# Patient Record
Sex: Male | Born: 1985 | Race: Black or African American | Hispanic: No | Marital: Single | Smoking: Former smoker
Health system: Southern US, Community
[De-identification: ages and names within clinical notes are randomized; demographics above are authoritative.]

---

## 2013-12-11 ENCOUNTER — Emergency Department: Payer: Self-pay | Admitting: Emergency Medicine

## 2014-11-04 ENCOUNTER — Encounter: Payer: Self-pay | Admitting: *Deleted

## 2014-11-04 ENCOUNTER — Emergency Department
Admission: EM | Admit: 2014-11-04 | Discharge: 2014-11-04 | Disposition: A | Discharge status: Home / Self Care | Payer: Self-pay | Attending: Emergency Medicine | Admitting: Emergency Medicine

## 2014-11-04 ENCOUNTER — Other Ambulatory Visit: Payer: Self-pay

## 2014-11-04 DIAGNOSIS — Z87891 Personal history of nicotine dependence: Secondary | ICD-10-CM | POA: Insufficient documentation

## 2014-11-04 DIAGNOSIS — R42 Dizziness and giddiness: Secondary | ICD-10-CM | POA: Insufficient documentation

## 2014-11-04 DIAGNOSIS — M6282 Rhabdomyolysis: Secondary | ICD-10-CM | POA: Insufficient documentation

## 2014-11-04 LAB — URINALYSIS COMPLETE WITH MICROSCOPIC (ARMC ONLY)
BACTERIA UA: NONE SEEN
Bilirubin Urine: NEGATIVE
Glucose, UA: NEGATIVE mg/dL
Hgb urine dipstick: NEGATIVE
Ketones, ur: NEGATIVE mg/dL
Leukocytes, UA: NEGATIVE
NITRITE: NEGATIVE
PH: 6 (ref 5.0–8.0)
PROTEIN: NEGATIVE mg/dL
SPECIFIC GRAVITY, URINE: 1.002 — AB (ref 1.005–1.030)
Squamous Epithelial / LPF: NONE SEEN

## 2014-11-04 LAB — CBC
HCT: 41.6 % (ref 40.0–52.0)
HEMOGLOBIN: 13.6 g/dL (ref 13.0–18.0)
MCH: 26.4 pg (ref 26.0–34.0)
MCHC: 32.7 g/dL (ref 32.0–36.0)
MCV: 80.7 fL (ref 80.0–100.0)
Platelets: 180 10*3/uL (ref 150–440)
RBC: 5.16 MIL/uL (ref 4.40–5.90)
RDW: 13.9 % (ref 11.5–14.5)
WBC: 4.6 10*3/uL (ref 3.8–10.6)

## 2014-11-04 LAB — ETHANOL: Alcohol, Ethyl (B): <5 mg/dL (ref <5)

## 2014-11-04 LAB — URINE DRUG SCREEN, QUALITATIVE (ARMC ONLY)
Amphetamines, Ur Screen: NOT DETECTED
BARBITURATES, UR SCREEN: NOT DETECTED
Benzodiazepine, Ur Scrn: NOT DETECTED
CANNABINOID 50 NG, UR ~~LOC~~: NOT DETECTED
COCAINE METABOLITE, UR ~~LOC~~: NOT DETECTED
MDMA (Ecstasy)Ur Screen: NOT DETECTED
Methadone Scn, Ur: NOT DETECTED
Opiate, Ur Screen: NOT DETECTED
PHENCYCLIDINE (PCP) UR S: NOT DETECTED
Tricyclic, Ur Screen: NOT DETECTED

## 2014-11-04 LAB — BASIC METABOLIC PANEL
Anion gap: 4 — ABNORMAL LOW (ref 5–15)
BUN: 17 mg/dL (ref 6–20)
CO2: 30 mmol/L (ref 22–32)
Calcium: 8.8 mg/dL — ABNORMAL LOW (ref 8.9–10.3)
Chloride: 101 mmol/L (ref 101–111)
Creatinine, Ser: 1.04 mg/dL (ref 0.61–1.24)
GFR calc Af Amer: >60 mL/min (ref >60)
GFR calc non Af Amer: >60 mL/min (ref >60)
GLUCOSE: 102 mg/dL — AB (ref 65–99)
POTASSIUM: 4.2 mmol/L (ref 3.5–5.1)
SODIUM: 135 mmol/L (ref 135–145)

## 2014-11-04 LAB — CK: Total CK: 671 U/L — ABNORMAL HIGH (ref 49–397)

## 2014-11-04 NOTE — ED Provider Notes (Signed)
[facility] [place] Emergency Department Provider Note  ____________________________________________  Time seen: Approximately 4:19 AM  I have reviewed the triage vital signs and the nursing notes.   HISTORY  Chief Complaint Dizziness    HPI Brett Castro is a 29 y.o. male who presents to the ED from home for a chief complaint of dizziness. Patient states he has been feeling intermittently dizzy and lightheaded for the past 2 days. Symptoms are associated with nausea without vomiting. Patient came to the ED for evaluation yesterday morning; however, patient left prior to being seen because he was feeling better. Patient believes he is dehydrated because he has been playing basketball for the past 2 days. Patient was awake and had a less than 2 second episode of chest pain approximately 3 AM this morning not associated with sweating, shortness of breath, nausea or vomiting. Patient currently voices no complaints of chest pain or dizziness.Denies recent travel, surgery or hormone use.   History reviewed. No pertinent past medical history.  There are no active problems to display for this patient.   History reviewed. No pertinent past surgical history.  No current outpatient prescriptions on file.  Allergies Review of patient's allergies indicates no known allergies.  History reviewed. No pertinent family history.  Social History History  Substance Use Topics  . Smoking status: Former Smoker    Quit date: 11/03/2009  . Smokeless tobacco: Never Used  . Alcohol Use: Yes     Comment: occasionally, half a pint  Denies illicit drug use  Review of Systems Constitutional: No fever/chills Eyes: No visual changes. ENT: No sore throat. Cardiovascular: Positive for chest pain. Respiratory: Denies shortness of breath. Gastrointestinal: No abdominal pain.  No nausea, no vomiting.  No diarrhea.  No constipation. Genitourinary: Negative for dysuria. Musculoskeletal:  Negative for back pain. Skin: Negative for rash. Neurological: Negative for headaches, focal weakness or numbness. Positive for dizziness.  10-point ROS otherwise negative.  ____________________________________________   PHYSICAL EXAM:  VITAL SIGNS: ED Triage Vitals  Enc Vitals Group     BP 11/04/14 0325 146/99 mmHg     Pulse Rate 11/04/14 0325 66     Resp 11/04/14 0325 16     Temp 11/04/14 0325 98.3 F (36.8 C)     Temp Source 11/04/14 0325 Oral     SpO2 --      Weight 11/04/14 0325 280 lb (127.007 kg)     Height 11/04/14 0325  (1.88 m)     Head Cir --      Peak Flow --      Pain Score --      Pain Loc --      Pain Edu? --      Excl. in GC? --     Constitutional: Alert and oriented. Well appearing and in no acute distress. Eyes: Conjunctivae are normal. PERRL. EOMI. Head: Atraumatic. Nose: No congestion/rhinnorhea. Mouth/Throat: Mucous membranes are moist.  Oropharynx non-erythematous. Neck: No stridor. No carotid bruits. Cardiovascular: Normal rate, regular rhythm. Grossly normal heart sounds.  Good peripheral circulation. Respiratory: Normal respiratory effort.  No retractions. Lungs CTAB. Gastrointestinal: Soft and nontender. No distention. No abdominal bruits. No CVA tenderness. Musculoskeletal: No lower extremity tenderness nor edema.  No joint effusions. Neurologic:  Normal speech and language. No gross focal neurologic deficits are appreciated. No gait instability. Skin:  Skin is warm, dry and intact. No rash noted. Psychiatric: Mood and affect are normal. Speech and behavior are normal.  ____________________________________________   LABS (all  labs ordered are listed, but only abnormal results are displayed)  Labs Reviewed  BASIC METABOLIC PANEL - Abnormal; Notable for the following:    Glucose, Bld 102 (*)    Calcium 8.8 (*)    Anion gap 4 (*)    All other components within normal limits  URINALYSIS COMPLETEWITH MICROSCOPIC ([facility] ONLY) -  Abnormal; Notable for the following:    Color, Urine COLORLESS (*)    APPearance CLEAR (*)    Specific Gravity, Urine 1.002 (*)    All other components within normal limits  CK - Abnormal; Notable for the following:    Total CK 671 (*)    All other components within normal limits  CBC  ETHANOL  URINE DRUG SCREEN, QUALITATIVE ([facility] ONLY)  CBG MONITORING, ED   ____________________________________________  EKG  ED ECG REPORT I, Theadore Blunck J, the attending physician, personally viewed and interpreted this ECG.   Date: 11/04/2014  EKG Time: 0332  Rate: 64  Rhythm: normal EKG, normal sinus rhythm  Axis: Normal  Intervals:none  ST&T Change: Nonspecific  ____________________________________________  RADIOLOGY  None ____________________________________________   PROCEDURES  Procedure(s) performed: None  Critical Care performed: No  ____________________________________________   INITIAL IMPRESSION / ASSESSMENT AND PLAN / ED COURSE  Pertinent labs & imaging results that were available during my care of the patient were reviewed by me and considered in my medical decision making (see chart for details).  29 year old male who presents with dizziness and feeling dehydrated after playing basketball for the past 2 days. Patient was a difficult stick; as patient is not nauseated or vomiting, we will orally hydrate. Check screening labs including CK, urinalysis and reassess. Do not feel troponin is medically necessary given the very low suspicion for acute coronary syndrome with reassuring EKG and no chest pain currently.Marland Kitchen.  ----------------------------------------- 6:46 AM on 11/04/2014 -----------------------------------------  Patient is soundly asleep. Updated patient of results. Encouraged patient to remain indoors and hydrate orally this weekend. Strict return precautions given. Patient verbalizes understanding and agree with plan of care. He will follow up with his PCP for  blood pressure recheck. ____________________________________________   FINAL CLINICAL IMPRESSION(S) / ED DIAGNOSES  Final diagnoses:  Dizzy  Non-traumatic rhabdomyolysis      Irean HongJade J Kirstin Kugler, MD 11/05/14 [phone]

## 2014-11-04 NOTE — ED Notes (Signed)
Patient stable at time of discharge. 

## 2014-11-04 NOTE — Discharge Instructions (Signed)
1. Stay indoors this weekend and drink plenty of fluids. 2. Return to the ER for worsening symptoms, persistent vomiting, difficulty breathing or other concerns.  Dizziness Dizziness is a common problem. It is a feeling of unsteadiness or light-headedness. You may feel like you are about to faint. Dizziness can lead to injury if you stumble or fall. A person of any age group can suffer from dizziness, but dizziness is more common in older adults. CAUSES  Dizziness can be caused by many different things, including:  Middle ear problems.  Standing for too long.  Infections.  An allergic reaction.  Aging.  An emotional response to something, such as the sight of blood.  Side effects of medicines.  Tiredness.  Problems with circulation or blood pressure.  Excessive use of alcohol or medicines, or illegal drug use.  Breathing too fast (hyperventilation).  An irregular heart rhythm (arrhythmia).  A low red blood cell count (anemia).  Pregnancy.  Vomiting, diarrhea, fever, or other illnesses that cause body fluid loss (dehydration).  Diseases or conditions such as Parkinson's disease, high blood pressure (hypertension), diabetes, and thyroid problems.  Exposure to extreme heat. DIAGNOSIS  Your health care provider will ask about your symptoms, perform a physical exam, and perform an electrocardiogram (ECG) to record the electrical activity of your heart. Your health care provider may also perform other heart or blood tests to determine the cause of your dizziness. These may include:  Transthoracic echocardiogram (TTE). During echocardiography, sound waves are used to evaluate how blood flows through your heart.  Transesophageal echocardiogram (TEE).  Cardiac monitoring. This allows your health care provider to monitor your heart rate and rhythm in real time.  Holter monitor. This is a portable device that records your heartbeat and can help diagnose heart arrhythmias. It  allows your health care provider to track your heart activity for several days if needed.  Stress tests by exercise or by giving medicine that makes the heart beat faster. TREATMENT  Treatment of dizziness depends on the cause of your symptoms and can vary greatly. HOME CARE INSTRUCTIONS   Drink enough fluids to keep your urine clear or pale yellow. This is especially important in very hot weather. In older adults, it is also important in cold weather.  Take your medicine exactly as directed if your dizziness is caused by medicines. When taking blood pressure medicines, it is especially important to get up slowly.  Rise slowly from chairs and steady yourself until you feel okay.  In the morning, first sit up on the side of the bed. When you feel okay, stand slowly while holding onto something until you know your balance is fine.  Move your legs often if you need to stand in one place for a long time. Tighten and relax your muscles in your legs while standing.  Have someone stay with you for 1-2 days if dizziness continues to be a problem. Do this until you feel you are well enough to stay alone. Have the person call your health care provider if he or she notices changes in you that are concerning.  Do not drive or use heavy machinery if you feel dizzy.  Do not drink alcohol. SEEK IMMEDIATE MEDICAL CARE IF:   Your dizziness or light-headedness gets worse.  You feel nauseous or vomit.  You have problems talking, walking, or using your arms, hands, or legs.  You feel weak.  You are not thinking clearly or you have trouble forming sentences. It may take a  friend or family member to notice this.  You have chest pain, abdominal pain, shortness of breath, or sweating.  Your vision changes.  You notice any bleeding.  You have side effects from medicine that seems to be getting worse rather than better. MAKE SURE YOU:   Understand these instructions.  Will watch your  condition.  Will get help right away if you are not doing well or get worse. Document Released: 10/01/2000 Document Revised: 04/12/2013 Document Reviewed: 10/25/2010 [employer] Patient Information 2015 [place], [place]. This information is not intended to replace advice given to you by your health care provider. Make sure you discuss any questions you have with your health care provider.  Rhabdomyolysis Rhabdomyolysis is the breakdown of muscle fibers due to injury. The injury may come from physical damage to the muscle like an injury but other causes are:  High fever (hyperthermia).  Seizures (convulsions).  Low phosphate levels.  Diseases of metabolism.  Heatstroke.  Drug toxicity.  Over exertion.  Alcoholism.  Muscle is cut off from oxygen (anoxia).  The squeezing of nerves and blood vessels (compartment syndrome). Some drugs which may cause the breakdown of muscle are:  Antibiotics.  Statins.  Alcohol.  Animal toxins. Myoglobin is a substance which helps muscle use oxygen. When the muscle is damaged, the myoglobin is released into the bloodstream. It is filtered out of the bloodstream by the kidneys. Myoglobin may block up the kidneys. This may cause damage, such as kidney failure. It also breaks down into other damaging toxic parts, which also cause kidney failure.  SYMPTOMS   Dark, red, or tea colored urine.  Weakness of affected muscles.  Weight gain from water retention.  Joint aches and pains.  Irregular heart from high potassium in the blood.  Muscle tenderness or aching.  Generalized weakness.  Seizures.  Feeling tired (fatigue). DIAGNOSIS  Your caregiver may find muscle tenderness on exam and suspect the problem. Urine tests and blood work can confirm the problem. TREATMENT   Early and aggressive treatment with large amounts of fluids may help prevent kidney failure.  Water producing medicine (diuretic) may be used to help flush the  kidneys.  High potassium and calcium problems (electrolyte) in your blood may need treatment. HOME CARE INSTRUCTIONS  This problem is usually cared for in a hospital. If you are allowed to go home and require dialysis, make sure you keep all appointments for lab work and dialysis. Not doing so could result in death. Document Released: 04-03-2004 Document Revised: 06/30/2011 Document Reviewed: 10/02/2008 [facility] Patient Information 2015 [place], [place]. This information is not intended to replace advice given to you by your health care provider. Make sure you discuss any questions you have with your health care provider.

## 2014-11-04 NOTE — ED Notes (Signed)
Pt c/o intermittent dizziness since yesterday morning. Pt states nausea accompanied dizziness. Pt states he had a brief episode of chest pain at 0310 today that he said was aching and resolved.

## 2014-11-04 NOTE — ED Notes (Signed)
Pt reports feeling dizzy intermittently over the last few days. Pt reports he came here on Friday morning and sat in his car for about 20 mins then got feeling better so he left before coming in. Pt reports tonight about 20 mins prior to arriving here he became dizzy feeling again. Pt states he has sinus congestion and uses otc zyrtec as needed for that and pt also reports he thinks he may be dehydrated.

## 2015-01-18 ENCOUNTER — Emergency Department
Admission: EM | Admit: 2015-01-18 | Discharge: 2015-01-18 | Disposition: A | Discharge status: Home / Self Care | Payer: PRIVATE HEALTH INSURANCE | Attending: Emergency Medicine | Admitting: Emergency Medicine

## 2015-01-18 ENCOUNTER — Encounter: Payer: Self-pay | Admitting: Emergency Medicine

## 2015-01-18 DIAGNOSIS — Z87891 Personal history of nicotine dependence: Secondary | ICD-10-CM | POA: Diagnosis not present

## 2015-01-18 DIAGNOSIS — L03113 Cellulitis of right upper limb: Secondary | ICD-10-CM | POA: Diagnosis not present

## 2015-01-18 DIAGNOSIS — L02413 Cutaneous abscess of right upper limb: Secondary | ICD-10-CM | POA: Diagnosis present

## 2015-01-18 MED ORDER — SULFAMETHOXAZOLE-TRIMETHOPRIM 800-160 MG PO TABS: 2.0000 | ORAL_TABLET | Freq: Two times a day (BID) | ORAL | Status: AC

## 2015-01-18 NOTE — Discharge Instructions (Signed)
Abscess An abscess (boil or furuncle) is an infected area on or under the skin. This area is filled with yellowish-white fluid (pus) and other material (debris). HOME CARE   Only take medicines as told by your doctor.  If you were given antibiotic medicine, take it as directed. Finish the medicine even if you start to feel better.  If gauze is used, follow your doctor's directions for changing the gauze.  To avoid spreading the infection:  Keep your abscess covered with a bandage.  Wash your hands well.  Do not share personal care items, towels, or whirlpools with others.  Avoid skin contact with others.  Keep your skin and clothes clean around the abscess.  Keep all doctor visits as told. GET HELP RIGHT AWAY IF:   You have more pain, puffiness (swelling), or redness in the wound site.  You have more fluid or blood coming from the wound site.  You have muscle aches, chills, or you feel sick.  You have a fever. MAKE SURE YOU:   Understand these instructions.  Will watch your condition.  Will get help right away if you are not doing well or get worse. Document Released: 09/24/2007 Document Revised: 10/07/2011 Document Reviewed: 06/20/2011 [facility] Patient Information 2015 [place], [place]. This information is not intended to replace advice given to you by your health care provider. Make sure you discuss any questions you have with your health care provider.  Cellulitis Cellulitis is an infection of the skin and the tissue under the skin. The infected area is usually red and tender. This happens most often in the arms and lower legs. HOME CARE   Take your antibiotic medicine as told. Finish the medicine even if you start to feel better.  Keep the infected arm or leg raised (elevated).  Put a warm cloth on the area up to 4 times per day.  Only take medicines as told by your doctor.  Keep all doctor visits as told. GET HELP IF:  You see red streaks on the skin  coming from the infected area.  Your red area gets bigger or turns a dark color.  Your bone or joint under the infected area is painful after the skin heals.  Your infection comes back in the same area or different area.  You have a puffy (swollen) bump in the infected area.  You have new symptoms.  You have a fever. GET HELP RIGHT AWAY IF:   You feel very sleepy.  You throw up (vomit) or have watery poop (diarrhea).  You feel sick and have muscle aches and pains. MAKE SURE YOU:   Understand these instructions.  Will watch your condition.  Will get help right away if you are not doing well or get worse. Document Released: 09/24/2007 Document Revised: 08/22/2013 Document Reviewed: 06/23/2011 [facility] Patient Information 2015 [place], [place]. This information is not intended to replace advice given to you by your health care provider. Make sure you discuss any questions you have with your health care provider.  Apply warm compresses and take the antibiotic as directed until completely gone. Return to the ED in 3-5 days for wound check and potential drainage.

## 2015-01-18 NOTE — ED Notes (Signed)
Possible abscces or bug bite to right elbow

## 2015-01-18 NOTE — ED Provider Notes (Signed)
[facility] Emergency Department Provider Note ____________________________________________  Time seen: 1515  I have reviewed the triage vital signs and the nursing notes.  HISTORY  Chief Complaint  Abscess  HPI Brett Castro is a 29 y.o. male reports to the ED for evaluation of an abscess to his right forearm. He is not sure of the actual cause, but notes her last week and a half he's had this area of redness, firmness, and tenderness to the right forearm. He does note a central area that he admits to squeezing and expressing some purulent discharge from today. Since that time he's had some increasing pain to the right arm ascending towards the elbow and shoulder. He denies any outright fevers, chills, or sweats. He denies nausea, vomiting, or malaise. He reports a similar infected area to his pinky a few months back. But it resolved without treatment.He rates his pain in triage at a 6/10.  History reviewed. No pertinent past medical history.  There are no active problems to display for this patient.  History reviewed. No pertinent past surgical history.  Current Outpatient Rx  Name  Route  Sig  Dispense  Refill  . sulfamethoxazole-trimethoprim (BACTRIM DS,SEPTRA DS) 800-160 MG tablet   Oral   Take 2 tablets by mouth 2 (two) times daily.   40 tablet   0    Allergies Review of patient's allergies indicates no known allergies.  No family history on file.  Social History Social History  Substance Use Topics  . Smoking status: Former Smoker    Quit date: 11/03/2009  . Smokeless tobacco: Never Used  . Alcohol Use: Yes     Comment: occasionally, half a pint   Review of Systems  Constitutional: Negative for fever. Eyes: Negative for visual changes. ENT: Negative for sore throat. Cardiovascular: Negative for chest pain. Respiratory: Negative for shortness of breath. Gastrointestinal: Negative for abdominal pain, vomiting and  diarrhea. Genitourinary: Negative for dysuria. Musculoskeletal: Negative for back pain. Skin: Negative for rash. Tender firm area to the right forearm as above. Neurological: Negative for headaches, focal weakness or numbness. ____________________________________________  PHYSICAL EXAM:  VITAL SIGNS: ED Triage Vitals  Enc Vitals Group     BP 01/18/15 1403 132/84 mmHg     Pulse Rate 01/18/15 1403 84     Resp 01/18/15 1403 18     Temp 01/18/15 1403 98.9 F (37.2 C)     Temp Source 01/18/15 1403 Oral     SpO2 01/18/15 1403 100 %     Weight 01/18/15 1403 290 lb (131.543 kg)     Height 01/18/15 1403  (1.854 m)     Head Cir --      Peak Flow --      Pain Score 01/18/15 1403 6     Pain Loc --      Pain Edu? --      Excl. in GC? --    Constitutional: Alert and oriented. Well appearing and in no distress. Eyes: Conjunctivae are normal. PERRL. Normal extraocular movements. ENT   Head: Normocephalic and atraumatic.   Nose: No congestion/rhinorrhea.   Mouth/Throat: Mucous membranes are moist.   Neck: Supple. No thyromegaly. Hematological/Lymphatic/Immunological: No cervical lymphadenopathy. Patient with palpable epitrochlear node on the right elbow. Cardiovascular: Normal rate, regular rhythm.  Respiratory: Normal respiratory effort. No wheezes/rales/rhonchi. Gastrointestinal: Soft and nontender. No distention. Musculoskeletal: Nontender with normal range of motion in all extremities.  Neurologic:  Normal gait without ataxia. Normal speech and language. No gross  focal neurologic deficits are appreciated. Skin:  Skin is warm, dry and intact. No rash noted. The right volar forearm with large area of firmness, induration, and erythema. Noted is also a central, superficial papule with non-intact skin. There is no fluctuance, pointing, or spontaneous drainage noted. Psychiatric: Mood and affect are normal. Patient exhibits appropriate insight and  judgment. ____________________________________________  INITIAL IMPRESSION / ASSESSMENT AND PLAN / ED COURSE  Patient with a superficial cellulitis to the right forearm, without appreciable central abscess, fluctuance, or pointing. No indication on presentation today for incision and drainage. Patient is discharged with Bactrim to dose 2 tabs twice a day for 10 days. He is encouraged to apply warm compresses and return to the ED in 2-3 days for wound check and probable I&D at that time. ____________________________________________  FINAL CLINICAL IMPRESSION(S) / ED DIAGNOSES  Final diagnoses:  Cellulitis of forearm, right      Lissa Hoard, PA-C 01/18/15 1647  Minna Antis, MD 01/18/15 2012

## 2016-01-29 IMAGING — DX RIGHT LITTLE FINGER 2+V
3 series · 3 of 3 positions shown · non-contrast
Comparison: None.

CLINICAL DATA: Finger swelling and pain after trauma

EXAM:
RIGHT LITTLE FINGER 2+V

[finger ap]
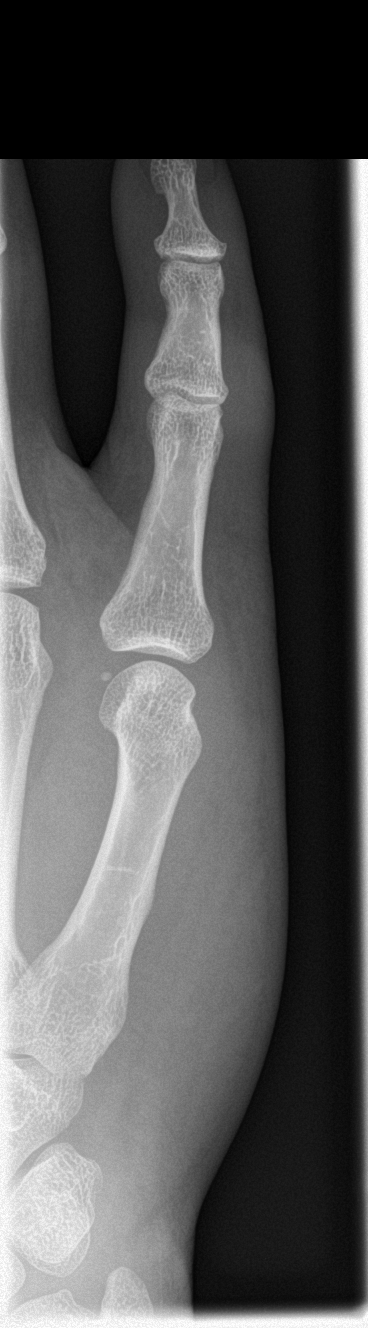

[finger obl]
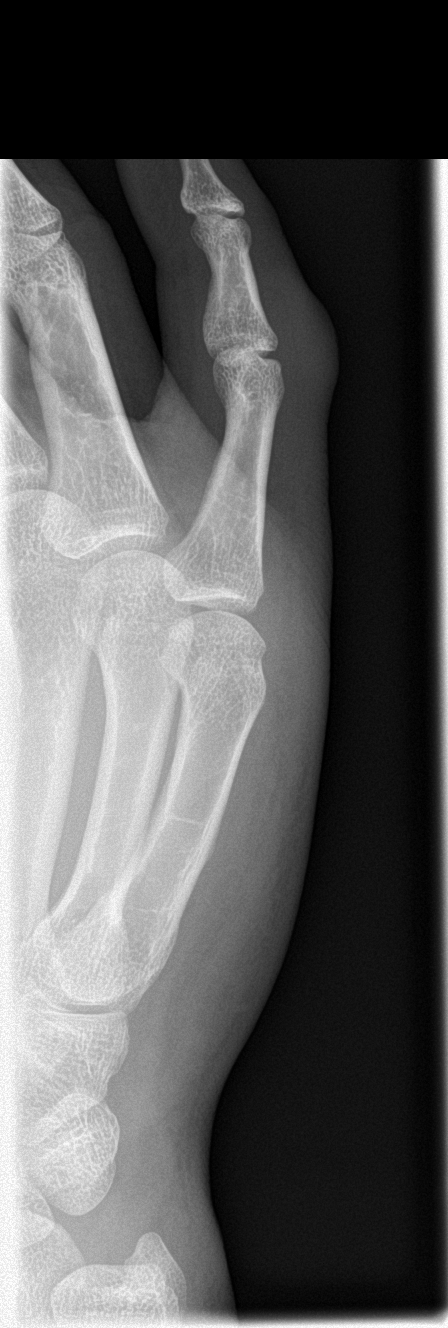

[finger lat]
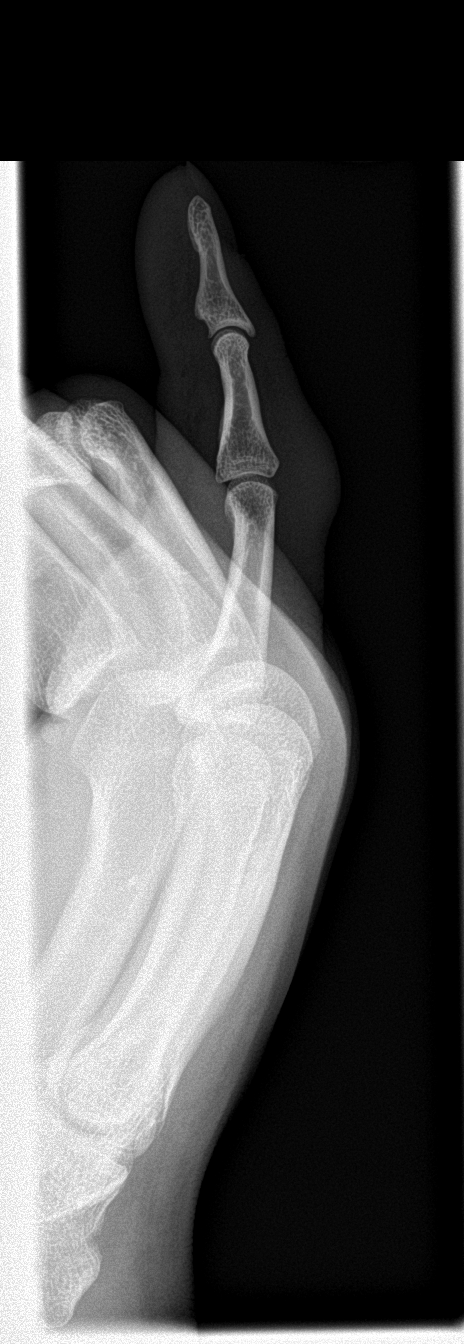

[3 of 3 positions shown; findings below may reference images not displayed]

FINDINGS: There is multi focal soft tissue swelling about the fifth metacarpal
and digit. No acute fracture, malalignment, or joint narrowing. No
radiopaque foreign body.
IMPRESSION: No acute osseous findings.
